# Patient Record
Sex: Male | Born: 2005
Health system: Southern US, Community
[De-identification: ages and names within clinical notes are randomized; demographics above are authoritative.]

---
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
## 2024-10-14 ENCOUNTER — Ambulatory Visit

## 2024-10-14 ENCOUNTER — Ambulatory Visit
Admission: EM | Admit: 2024-10-14 | Discharge: 2024-10-14 | Disposition: A | Discharge status: Home / Self Care | Attending: Physician Assistant | Admitting: Physician Assistant

## 2024-10-14 DIAGNOSIS — R052 Subacute cough: Secondary | ICD-10-CM | POA: Diagnosis not present

## 2024-10-14 DIAGNOSIS — R058 Other specified cough: Secondary | ICD-10-CM | POA: Diagnosis not present

## 2024-10-14 MED ORDER — PROMETHAZINE-DM 6.25-15 MG/5ML PO SYRP: 5.0000 mL | ORAL_SOLUTION | Freq: Four times a day (QID) | ORAL | 0 refills | Status: AC | PRN

## 2024-10-14 NOTE — ED Provider Notes (Signed)
 " [facility]    CSN: [id number] Arrival date & time: 10/14/24  1131      History   Chief Complaint Chief Complaint  Patient presents with   Cough    HPI Russell Villarreal is a 19 y.o. male presenting for 1 month history of cough.  Patient reports having a cold at onset of symptoms this is the cough is lingered.  Sometimes it is dry and sometimes it is productive.  He says he does not feel ill.  He denies fever, fatigue, sore throat, sinus pain, ear pain, chest pain, wheezing, shortness of breath.  He has not taken any cough medication for the past couple weeks.  He has no history of acid reflux, allergies or asthma.  He is also a non-smoker.  No other complaints.  HPI  History reviewed. No pertinent past medical history.  There are no active problems to display for this patient.   Past Surgical History:  Procedure Laterality Date   TONSILLECTOMY  12/19/2016       Home Medications    Prior to Admission medications  Medication Sig Start Date End Date Taking? Authorizing Provider  promethazine -dextromethorphan (PROMETHAZINE -DM) 6.25-15 MG/5ML syrup Take 5 mLs by mouth 4 (four) times daily as needed. 10/14/24  Yes Arvis Huxley B, PA-C  albuterol  (PROVENTIL ) (2.5 MG/3ML) 0.083% nebulizer solution Take 3 mLs (2.5 mg total) by nebulization every 6 (six) hours as needed for wheezing or shortness of breath. Patient not taking: Reported on 10/14/2024 11/28/18   Arloa Ozell BIRCH, PA-C  albuterol  (VENTOLIN  HFA) 108 (90 Base) MCG/ACT inhaler Inhale 2 puffs into the lungs every 4 (four) hours as needed for wheezing or shortness of breath. Patient not taking: Reported on 10/14/2024 11/02/21   Teresa Shelba SAUNDERS, NP  benzonatate  (TESSALON ) 100 MG capsule Take 1 capsule (100 mg total) by mouth every 8 (eight) hours. Patient not taking: Reported on 10/14/2024 11/02/21   Teresa Shelba SAUNDERS, NP    Family History Family History  Problem Relation Age of Onset   Healthy Mother     Hypertension Father     Social History Social History[1]   Allergies   Other and Fish allergy   Review of Systems Review of Systems  Constitutional:  Negative for fatigue and fever.  HENT:  Negative for congestion, rhinorrhea, sinus pressure, sinus pain and sore throat.   Respiratory:  Positive for cough. Negative for shortness of breath.   Cardiovascular:  Negative for chest pain.  Gastrointestinal:  Negative for abdominal pain, diarrhea, nausea and vomiting.  Musculoskeletal:  Negative for myalgias.  Neurological:  Negative for weakness, light-headedness and headaches.  Hematological:  Negative for adenopathy.     Physical Exam Triage Vital Signs ED Triage Vitals  Encounter Vitals Group     BP 10/14/24 1159 131/62     Girls Systolic BP Percentile --      Girls Diastolic BP Percentile --      Boys Systolic BP Percentile --      Boys Diastolic BP Percentile --      Pulse Rate 10/14/24 1159 62     Resp 10/14/24 1159 19     Temp 10/14/24 1159 98 F (36.7 C)     Temp src --      SpO2 10/14/24 1159 100 %     Weight 10/14/24 1158 200 lb 9.6 oz (91 kg)     Height --      Head Circumference --      Peak Flow --  Pain Score 10/14/24 1158 0     Pain Loc --      Pain Education --      Exclude from Growth Chart --    No data found.  Updated Vital Signs BP 131/62   Pulse 62   Temp 98 F (36.7 C)   Resp 19   Wt 200 lb 9.6 oz (91 kg)   SpO2 100%     Physical Exam Vitals and nursing note reviewed.  Constitutional:      General: He is not in acute distress.    Appearance: Normal appearance. He is well-developed. He is not ill-appearing.  HENT:     Head: Normocephalic and atraumatic.     Nose: Nose normal.     Mouth/Throat:     Mouth: Mucous membranes are moist.     Pharynx: Oropharynx is clear.  Eyes:     Conjunctiva/sclera: Conjunctivae normal.  Cardiovascular:     Rate and Rhythm: Normal rate and regular rhythm.  Pulmonary:     Effort: Pulmonary  effort is normal. No respiratory distress.     Breath sounds: Normal breath sounds.  Musculoskeletal:     Cervical back: Neck supple.  Skin:    General: Skin is warm and dry.     Capillary Refill: Capillary refill takes less than 2 seconds.  Neurological:     General: No focal deficit present.     Mental Status: He is alert. Mental status is at baseline.     Motor: No weakness.     Gait: Gait normal.  Psychiatric:        Mood and Affect: Mood normal.        Behavior: Behavior normal.      UC Treatments / Results  Labs (all labs ordered are listed, but only abnormal results are displayed) Labs Reviewed - No data to display  EKG   Radiology DG Chest 2 View Result Date: 10/14/2024 CLINICAL DATA:  One month history of cough EXAM: CHEST - 2 VIEW COMPARISON:  None Available. FINDINGS: The lungs are clear and negative for focal airspace consolidation, pulmonary edema or suspicious pulmonary nodule. No pleural effusion or pneumothorax. Cardiac and mediastinal contours are within normal limits. No acute fracture or lytic or blastic osseous lesions. The visualized upper abdominal bowel gas pattern is unremarkable. IMPRESSION: Normal chest x-ray. Electronically Signed   By: Wilkie Lent M.D.   On: 10/14/2024 12:43    Procedures Procedures (including critical care time)  Medications Ordered in UC Medications - No data to display  Initial Impression / Assessment and Plan / UC Course  I have reviewed the triage vital signs and the nursing notes.  Pertinent labs & imaging results that were available during my care of the patient were reviewed by me and considered in my medical decision making (see chart for details).   19 year old male presents for 1 month history of cough which is occasionally dry and sometimes productive.  He had cold-like symptoms at onset.  No fever, fatigue, chest pain or shortness of breath.  Patient does not feel ill and is bothered by the persistent  cough.  Exam is normal today.  Chest clear.  Heart regular rate and rhythm.  Chest x-ray performed is negative.  Viral illness/postviral cough.  Supportive care encouraged.  Explained they will takes more time for the cough to resolve.  I sent Promethazine  DM as needed.  Encouraged plenty rest and fluids.  Advised to return if fever, worsening cough, chest pain or  breathing issue.   Final Clinical Impressions(s) / UC Diagnoses   Final diagnoses:  Subacute cough  Post-viral cough syndrome     Discharge Instructions      - Your chest x-ray was normal. - It is not abnormal to have a lingering cough after viral infection. - If you develop a fever, chest pain, shortness of breath or weakness please return for reevaluation.  Cough should continue to improve over the next couple weeks.     ED Prescriptions     Medication Sig Dispense Auth. Provider   promethazine -dextromethorphan (PROMETHAZINE -DM) 6.25-15 MG/5ML syrup Take 5 mLs by mouth 4 (four) times daily as needed. 118 mL Arvis Jolan NOVAK, PA-C      PDMP not reviewed this encounter.     [1]  Social History Tobacco Use   Smoking status: Never   Smokeless tobacco: Never  Vaping Use   Vaping status: Never Used  Substance Use Topics   Alcohol use: No   Drug use: No     Arvis Jolan NOVAK, PA-C 10/14/24 1255  "

## 2024-10-14 NOTE — ED Triage Notes (Signed)
 Patient to Urgent Care with complaints of persistent and productive cough.  Symptoms x1 month.  Gargling salt water. No otc meds for the last 3 weeks.SABRA

## 2024-10-14 NOTE — Discharge Instructions (Addendum)
-   Your chest x-ray was normal. - It is not abnormal to have a lingering cough after viral infection. - If you develop a fever, chest pain, shortness of breath or weakness please return for reevaluation.  Cough should continue to improve over the next couple weeks.
